# Patient Record
Sex: Female | Born: 1935 | Race: White | Hispanic: No | Smoking: Former smoker
Health system: Southern US, Community
[De-identification: ages and names within clinical notes are randomized; demographics above are authoritative.]

## PROBLEM LIST (undated history)

## (undated) DIAGNOSIS — E039 Hypothyroidism, unspecified: Secondary | ICD-10-CM

## (undated) DIAGNOSIS — B3731 Acute candidiasis of vulva and vagina: Secondary | ICD-10-CM

## (undated) DIAGNOSIS — I712 Thoracic aortic aneurysm, without rupture, unspecified: Secondary | ICD-10-CM

## (undated) DIAGNOSIS — I10 Essential (primary) hypertension: Secondary | ICD-10-CM

## (undated) DIAGNOSIS — K219 Gastro-esophageal reflux disease without esophagitis: Secondary | ICD-10-CM

## (undated) DIAGNOSIS — R0989 Other specified symptoms and signs involving the circulatory and respiratory systems: Secondary | ICD-10-CM

## (undated) DIAGNOSIS — M109 Gout, unspecified: Secondary | ICD-10-CM

## (undated) DIAGNOSIS — I739 Peripheral vascular disease, unspecified: Secondary | ICD-10-CM

## (undated) DIAGNOSIS — F419 Anxiety disorder, unspecified: Secondary | ICD-10-CM

## (undated) DIAGNOSIS — F0390 Unspecified dementia without behavioral disturbance: Secondary | ICD-10-CM

## (undated) DIAGNOSIS — B373 Candidiasis of vulva and vagina: Secondary | ICD-10-CM

## (undated) HISTORY — PX: ABDOMINAL HYSTERECTOMY: SHX81

## (undated) HISTORY — PX: CHOLECYSTECTOMY: SHX55

---

## 2019-05-11 ENCOUNTER — Emergency Department (HOSPITAL_BASED_OUTPATIENT_CLINIC_OR_DEPARTMENT_OTHER): Payer: Medicare Other

## 2019-05-11 ENCOUNTER — Encounter (HOSPITAL_BASED_OUTPATIENT_CLINIC_OR_DEPARTMENT_OTHER): Payer: Self-pay

## 2019-05-11 ENCOUNTER — Other Ambulatory Visit: Payer: Self-pay

## 2019-05-11 ENCOUNTER — Emergency Department (HOSPITAL_BASED_OUTPATIENT_CLINIC_OR_DEPARTMENT_OTHER)
Admission: EM | Admit: 2019-05-11 | Discharge: 2019-05-12 | Disposition: A | Discharge status: Home / Self Care | Payer: Medicare Other | Attending: Emergency Medicine | Admitting: Emergency Medicine

## 2019-05-11 DIAGNOSIS — E039 Hypothyroidism, unspecified: Secondary | ICD-10-CM | POA: Diagnosis not present

## 2019-05-11 DIAGNOSIS — K5792 Diverticulitis of intestine, part unspecified, without perforation or abscess without bleeding: Secondary | ICD-10-CM

## 2019-05-11 DIAGNOSIS — F0390 Unspecified dementia without behavioral disturbance: Secondary | ICD-10-CM | POA: Diagnosis not present

## 2019-05-11 DIAGNOSIS — I10 Essential (primary) hypertension: Secondary | ICD-10-CM | POA: Insufficient documentation

## 2019-05-11 DIAGNOSIS — K5732 Diverticulitis of large intestine without perforation or abscess without bleeding: Secondary | ICD-10-CM | POA: Diagnosis not present

## 2019-05-11 DIAGNOSIS — R1032 Left lower quadrant pain: Secondary | ICD-10-CM | POA: Diagnosis present

## 2019-05-11 DIAGNOSIS — Z87891 Personal history of nicotine dependence: Secondary | ICD-10-CM | POA: Insufficient documentation

## 2019-05-11 HISTORY — DX: Gout, unspecified: M10.9

## 2019-05-11 HISTORY — DX: Essential (primary) hypertension: I10

## 2019-05-11 HISTORY — DX: Candidiasis of vulva and vagina: B37.3

## 2019-05-11 HISTORY — DX: Anxiety disorder, unspecified: F41.9

## 2019-05-11 HISTORY — DX: Other specified symptoms and signs involving the circulatory and respiratory systems: R09.89

## 2019-05-11 HISTORY — DX: Unspecified dementia, unspecified severity, without behavioral disturbance, psychotic disturbance, mood disturbance, and anxiety: F03.90

## 2019-05-11 HISTORY — DX: Hypothyroidism, unspecified: E03.9

## 2019-05-11 HISTORY — DX: Gastro-esophageal reflux disease without esophagitis: K21.9

## 2019-05-11 HISTORY — DX: Thoracic aortic aneurysm, without rupture: I71.2

## 2019-05-11 HISTORY — DX: Peripheral vascular disease, unspecified: I73.9

## 2019-05-11 HISTORY — DX: Acute candidiasis of vulva and vagina: B37.31

## 2019-05-11 HISTORY — DX: Thoracic aortic aneurysm, without rupture, unspecified: I71.20

## 2019-05-11 LAB — CBC WITH DIFFERENTIAL/PLATELET
Abs Immature Granulocytes: 0.02 10*3/uL (ref 0.00–0.07)
Basophils Absolute: 0 10*3/uL (ref 0.0–0.1)
Basophils Relative: 0 %
Eosinophils Absolute: 0.2 10*3/uL (ref 0.0–0.5)
Eosinophils Relative: 2 %
HCT: 41.4 % (ref 36.0–46.0)
Hemoglobin: 13.9 g/dL (ref 12.0–15.0)
Immature Granulocytes: 0 %
Lymphocytes Relative: 15 %
Lymphs Abs: 1.3 10*3/uL (ref 0.7–4.0)
MCH: 30.6 pg (ref 26.0–34.0)
MCHC: 33.6 g/dL (ref 30.0–36.0)
MCV: 91.2 fL (ref 80.0–100.0)
Monocytes Absolute: 0.9 10*3/uL (ref 0.1–1.0)
Monocytes Relative: 11 %
Neutro Abs: 5.8 10*3/uL (ref 1.7–7.7)
Neutrophils Relative %: 72 %
Platelets: 140 10*3/uL — ABNORMAL LOW (ref 150–400)
RBC: 4.54 MIL/uL (ref 3.87–5.11)
RDW: 14.3 % (ref 11.5–15.5)
WBC: 8.2 10*3/uL (ref 4.0–10.5)
nRBC: 0 % (ref 0.0–0.2)

## 2019-05-11 LAB — COMPREHENSIVE METABOLIC PANEL
ALT: 13 U/L (ref 0–44)
AST: 25 U/L (ref 15–41)
Albumin: 3.5 g/dL (ref 3.5–5.0)
Alkaline Phosphatase: 56 U/L (ref 38–126)
Anion gap: 11 (ref 5–15)
BUN: 18 mg/dL (ref 8–23)
CO2: 23 mmol/L (ref 22–32)
Calcium: 8.4 mg/dL — ABNORMAL LOW (ref 8.9–10.3)
Chloride: 100 mmol/L (ref 98–111)
Creatinine, Ser: 0.93 mg/dL (ref 0.44–1.00)
GFR calc Af Amer: >60 mL/min (ref >60)
GFR calc non Af Amer: 56 mL/min — ABNORMAL LOW (ref >60)
Glucose, Bld: 205 mg/dL — ABNORMAL HIGH (ref 70–99)
Potassium: 3.8 mmol/L (ref 3.5–5.1)
Sodium: 134 mmol/L — ABNORMAL LOW (ref 135–145)
Total Bilirubin: 0.6 mg/dL (ref 0.3–1.2)
Total Protein: 7.4 g/dL (ref 6.5–8.1)

## 2019-05-11 LAB — URINALYSIS, ROUTINE W REFLEX MICROSCOPIC
Bilirubin Urine: NEGATIVE
Glucose, UA: NEGATIVE mg/dL
Ketones, ur: 15 mg/dL — AB
Nitrite: NEGATIVE
Protein, ur: NEGATIVE mg/dL
Specific Gravity, Urine: 1.02 (ref 1.005–1.030)
pH: 6.5 (ref 5.0–8.0)

## 2019-05-11 LAB — URINALYSIS, MICROSCOPIC (REFLEX)

## 2019-05-11 LAB — LIPASE, BLOOD: Lipase: 26 U/L (ref 11–51)

## 2019-05-11 MED ORDER — AMOXICILLIN-POT CLAVULANATE 875-125 MG PO TABS: 1.0000 | ORAL_TABLET | Freq: Two times a day (BID) | ORAL | 0 refills | Status: AC

## 2019-05-11 MED ORDER — AMOXICILLIN-POT CLAVULANATE 875-125 MG PO TABS
1.0000 | ORAL_TABLET | Freq: Once | ORAL | Status: AC
  Administered 2019-05-11: 1 via ORAL
  Filled 2019-05-11: qty 1

## 2019-05-11 MED ORDER — IOHEXOL 300 MG/ML  SOLN
100.0000 mL | Freq: Once | INTRAMUSCULAR | Status: AC | PRN
  Administered 2019-05-11: 100 mL via INTRAVENOUS

## 2019-05-11 NOTE — Discharge Instructions (Signed)
Your scan showed diverticulitis, please take Augmentin twice daily for the next week, make sure you take this with food on your stomach.  Continue taking laxatives to help make sure you are having bowel movements daily.  If you develop fevers, worsening pain, vomiting and cannot keep down antibiotics or any other new or concerning symptoms return to the ED otherwise follow closely with your PCP to ensure you are improving.

## 2019-05-11 NOTE — ED Triage Notes (Signed)
Per pt and daughter pt with lower abd x 3 days-denies n/v/d-denies urinary sx and bowel changes-NAD-to triage in w/c

## 2019-05-11 NOTE — ED Provider Notes (Signed)
[facility] EMERGENCY DEPARTMENT Provider Note   CSN: [id number] Arrival date & time: 05/11/19  1953     History Chief Complaint  Patient presents with  . Abdominal Pain    Valerie Little is a 86 y.o. female.  Valerie Little is a 86 y.o. female with a history of hypertension, GERD, hypothyroidism, PAD, thoracic aortic aneurysm, dementia, who presents to the ED for evaluation of 3 days of lower abdominal pain.  She is accompanied by her daughter who provides the majority of the history given patient's dementia.  She states that for the past 3 days her mom has been complaining intermittently of left lower quadrant abdominal pain.  She reports this is very atypical for her and she does not usually complain of belly pain.  She was previously seen and was told she was constipated so she has been taking MiraLAX every other day and has been having regular bowel movements.  She had some chills and a low-grade fever today.  Has not had any episodes of vomiting.  No blood noted in her stools.  She denies dysuria or urinary frequency.  Previous hysterectomy and cholecystectomy, no previous history of diverticulitis.        Past Medical History:  Diagnosis Date  . Anxiety   . Bruit   . Dementia (HCC)   . GERD (gastroesophageal reflux disease)   . Gout   . Hypertension   . Hypothyroid   . PAD (peripheral artery disease) (HCC)   . Thoracic aortic aneurysm (TAA) (HCC)   . Yeast vaginitis     There are no problems to display for this patient.   Past Surgical History:  Procedure Laterality Date  . ABDOMINAL HYSTERECTOMY    . CHOLECYSTECTOMY       OB History   No obstetric history on file.     No family history on file.  Social History   Tobacco Use  . Smoking status: Former Games developer  . Smokeless tobacco: Never Used  Substance Use Topics  . Alcohol use: Never  . Drug use: Never    Home Medications Prior to Admission medications   Not on File    Allergies     Allopurinol, Colchicine, Donepezil, Indomethacin, Memantine, Naproxen, and Rivastigmine  Review of Systems   Review of Systems  Constitutional: Positive for chills. Negative for fever.  HENT: Negative.   Respiratory: Negative for cough and shortness of breath.   Cardiovascular: Negative for chest pain.  Gastrointestinal: Positive for abdominal pain and diarrhea. Negative for blood in stool, constipation, nausea and vomiting.  Genitourinary: Negative for dysuria, flank pain and frequency.  Musculoskeletal: Negative for arthralgias, back pain and myalgias.  Skin: Negative for color change and rash.  Neurological: Negative for dizziness, syncope and light-headedness.    Physical Exam Updated Vital Signs BP (!) 168/87 (BP Location: Right Arm)   Pulse 89   Temp 99.2 F (37.3 C) (Oral)   Resp 14   SpO2 94%   Physical Exam Vitals and nursing note reviewed.  Constitutional:      General: She is not in acute distress.    Appearance: She is well-developed. She is not ill-appearing or diaphoretic.     Comments: Pleasantly demented, well-appearing  HENT:     Head: Normocephalic and atraumatic.  Eyes:     General:        Right eye: No discharge.        Left eye: No discharge.     Pupils: Pupils are  equal, round, and reactive to light.  Cardiovascular:     Rate and Rhythm: Normal rate and regular rhythm.     Heart sounds: Normal heart sounds. No murmur. No friction rub. No gallop.   Pulmonary:     Effort: Pulmonary effort is normal. No respiratory distress.     Breath sounds: Normal breath sounds. No wheezing or rales.     Comments: Respirations equal and unlabored, patient able to speak in full sentences, lungs clear to auscultation bilaterally Abdominal:     General: Bowel sounds are normal. There is no distension.     Palpations: Abdomen is soft. There is no mass.     Tenderness: There is abdominal tenderness in the left lower quadrant. There is no guarding.     Comments:  Abdomen is soft, nondistended, bowel sounds present throughout, there is focal tenderness in the left lower quadrant without guarding, all other quadrants nontender, no peritoneal signs, no CVA tenderness.  Musculoskeletal:        General: No deformity.     Cervical back: Neck supple.  Skin:    General: Skin is warm and dry.     Capillary Refill: Capillary refill takes less than 2 seconds.  Neurological:     Mental Status: She is alert.     Coordination: Coordination normal.     Comments: Speech is clear, able to follow commands Moves extremities without ataxia, coordination intact  Psychiatric:        Mood and Affect: Mood normal.        Behavior: Behavior normal.     ED Results / Procedures / Treatments   Labs (all labs ordered are listed, but only abnormal results are displayed) Labs Reviewed  CBC WITH DIFFERENTIAL/PLATELET - Abnormal; Notable for the following components:      Result Value   Platelets 140 (*)    All other components within normal limits  URINALYSIS, ROUTINE W REFLEX MICROSCOPIC - Abnormal; Notable for the following components:   APPearance CLOUDY (*)    Hgb urine dipstick TRACE (*)    Ketones, ur 15 (*)    Leukocytes,Ua TRACE (*)    All other components within normal limits  URINALYSIS, MICROSCOPIC (REFLEX) - Abnormal; Notable for the following components:   Bacteria, UA FEW (*)    All other components within normal limits  COMPREHENSIVE METABOLIC PANEL  LIPASE, BLOOD    EKG None  Radiology CT ABDOMEN PELVIS W CONTRAST  Result Date: 05/11/2019 CLINICAL DATA:  86 year old female with left lower quadrant abdominal pain. EXAM: CT ABDOMEN AND PELVIS WITH CONTRAST TECHNIQUE: Multidetector CT imaging of the abdomen and pelvis was performed using the standard protocol following bolus administration of intravenous contrast. CONTRAST:  OMNIPAQUE IOHEXOL 300 MG/ML  SOLN COMPARISON:  None. FINDINGS: Lower chest: There is emphysematous changes of the  visualized lung bases. The lung bases are otherwise clear. Coronary vascular calcifications noted. No intra-abdominal free air or free fluid. Hepatobiliary: The liver is unremarkable. There is mild intrahepatic biliary ductal dilatation as well as mild dilatation of the common bile duct, likely post cholecystectomy. No calcified stone noted in the central CBD. Pancreas: Unremarkable. No pancreatic ductal dilatation or surrounding inflammatory changes. Spleen: Normal in size without focal abnormality. Adrenals/Urinary Tract: The adrenal glands are unremarkable. There is no hydronephrosis on either side. There is symmetric enhancement and excretion contrast by both kidneys. The visualized ureters appear unremarkable. The urinary bladder is partially distended. Mild thickened appearance of the bladder wall, likely reactive to inflammatory  changes of the sigmoid colon. Stomach/Bowel: There is extensive sigmoid diverticulosis. There is inflammatory changes of a segment of the sigmoid colon consistent with acute diverticulitis. No diverticular abscess or perforation. There is no bowel obstruction. The appendix is not visualized with certainty. No inflammatory changes identified in the right lower quadrant. Vascular/Lymphatic: Advanced aortoiliac atherosclerotic disease. The IVC is unremarkable. No portal venous gas. There is no adenopathy. Reproductive: Hysterectomy. Other: None Musculoskeletal: Osteopenia with degenerative changes of the spine. No acute osseous pathology. IMPRESSION: Sigmoid diverticulitis. No diverticular abscess or perforation. Electronically Signed   By: Anner Crete M.D.   On: 05/11/2019 22:26    Procedures Procedures (including critical care time)  Medications Ordered in ED Medications - No data to display  ED Course  I have reviewed the triage vital signs and the nursing notes.  Pertinent labs & imaging results that were available during my care of the patient were reviewed by me  and considered in my medical decision making (see chart for details).    MDM Rules/Calculators/A&P                     86 year old female with history of dementia, presenting with 3 days of left lower quadrant abdominal pain, low-grade fever noted but all other vitals normal and patient is well-appearing.  She is focally tender in the left lower quadrant, but without peritoneal signs.  Will evaluate with abdominal labs and CT.  Concern for potential diverticulitis, colitis, UTI, kidney stone, pyelonephritis, appendicitis.  I have independently ordered, reviewed and interpreted all labs and imaging: CBC: No leukocytosis, normal hemoglobin CMP: Mild hyperglycemia, 205, no other significant electrolyte derangements, normal renal and liver function. Lipase: WNL Urinalysis: Trace leukocytes, few bacteria with 6-10 squamous cells, suspect contamination  CT abdomen pelvis consistent with uncomplicated sigmoid diverticulitis, no evidence of abscess or perforation, I have reviewed images independently and agree with radiologist finding.  Discussed findings with patient and daughter, patient is very well-appearing and tolerating p.o.  Will treat with Augmentin, first dose given here in the emergency department.  Encourage patient to continue bowel regimen, Tylenol as needed for pain.  Discharged home in good condition, strict return precautions discussed and PCP follow-up encouraged.  Patient discussed with Dr. Maryan Rued, who saw patient as well and agrees with plan.   Final Clinical Impression(s) / ED Diagnoses Final diagnoses:  Diverticulitis    Rx / DC Orders ED Discharge Orders         Ordered    amoxicillin-clavulanate (AUGMENTIN) 875-125 MG tablet  2 times daily     05/11/19 2307           Jacqlyn Larsen, PA-C 05/11/19 2341    Blanchie Dessert, MD 05/14/19 1547

## 2019-05-12 ENCOUNTER — Telehealth (HOSPITAL_BASED_OUTPATIENT_CLINIC_OR_DEPARTMENT_OTHER): Payer: Self-pay | Admitting: Emergency Medicine

## 2019-11-19 DEATH — deceased

## 2021-05-06 IMAGING — CT CT ABD-PELV W/ CM
2 of 5 series · 16 of 46 positions shown, 18 images · IV contrast (Omnipaque)
Comparison: None.

CLINICAL DATA: 86-year-old female with left lower quadrant
abdominal pain.

EXAM:
CT ABDOMEN AND PELVIS WITH CONTRAST
TECHNIQUE: Multidetector CT imaging of the abdomen and pelvis was performed
using the standard protocol following bolus administration of
intravenous contrast.
CONTRAST:  100mL OMNIPAQUE IOHEXOL 300 MG/ML  SOLN

[Series 2: axial st · axial · 0.66mm/px · z∈[-384,-74]mm · 13 of 70 slices shown, 15 images]
[im 4/70  soft-tissue]
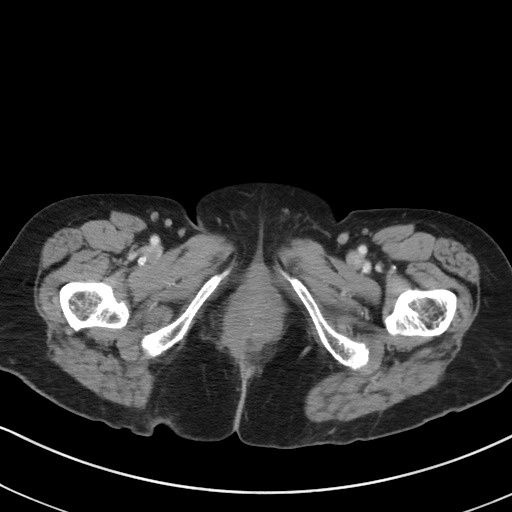
[im 4/70  bone]
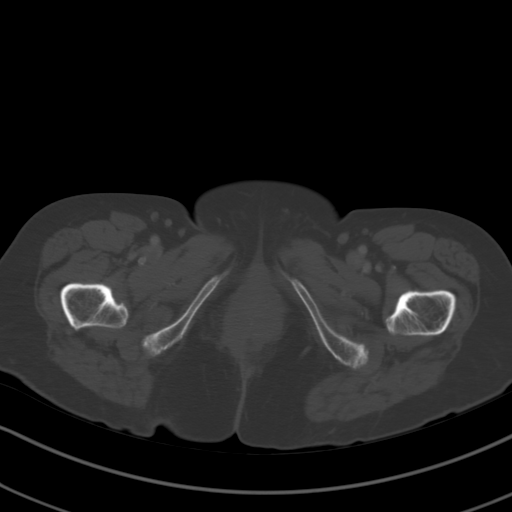
[im 11/70  soft-tissue]
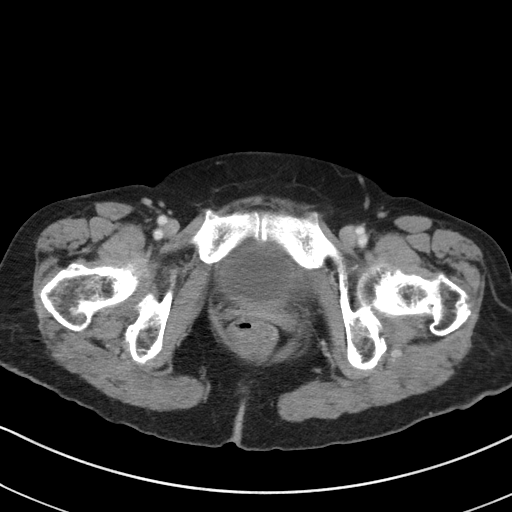
[im 15/70  soft-tissue]
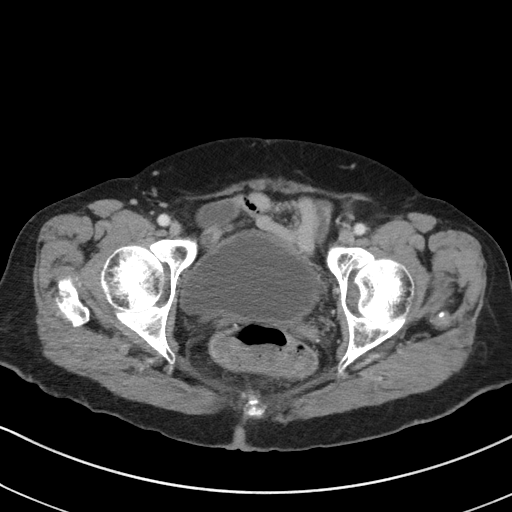
[im 19/70  soft-tissue]
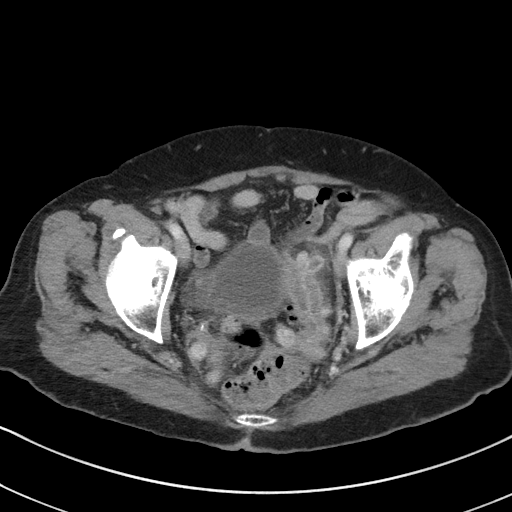
[im 26/70  soft-tissue]
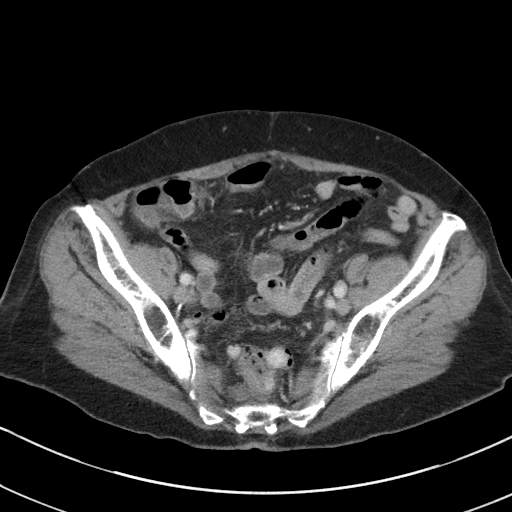
[im 30/70  soft-tissue]
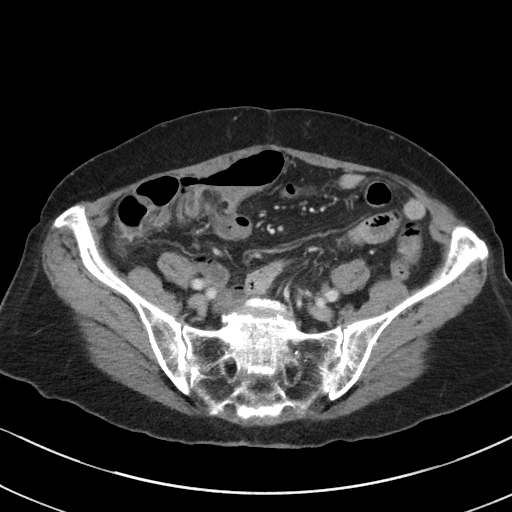
[im 37/70  soft-tissue]
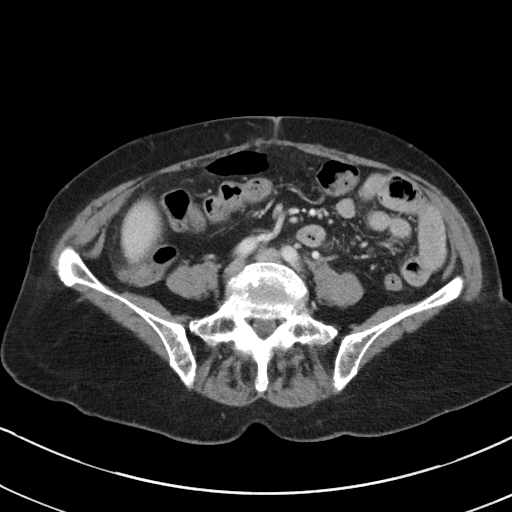
[im 40/70  soft-tissue]
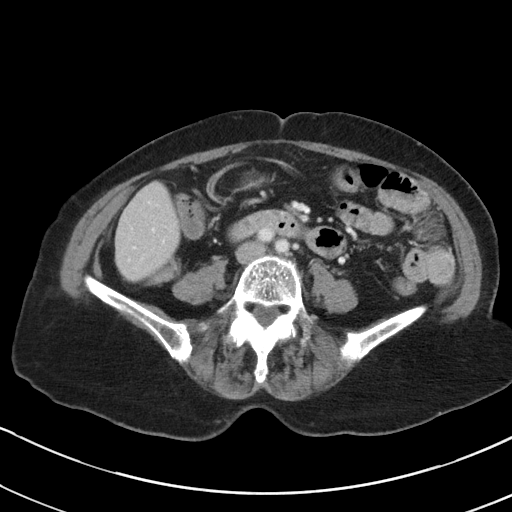
[im 44/70  soft-tissue]
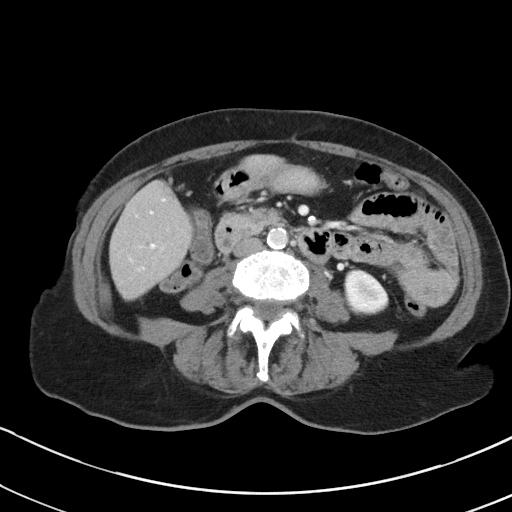
[im 44/70  bone]
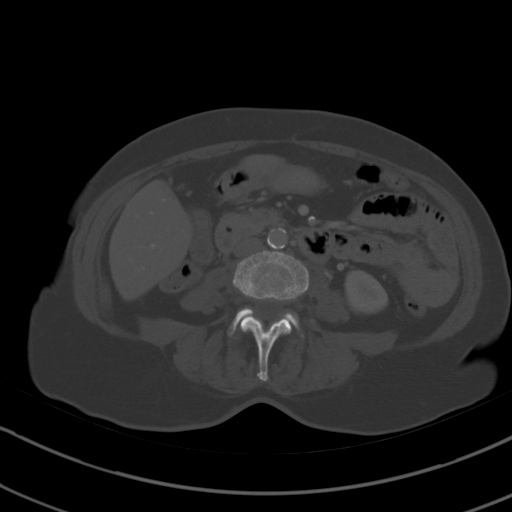
[im 51/70  soft-tissue]
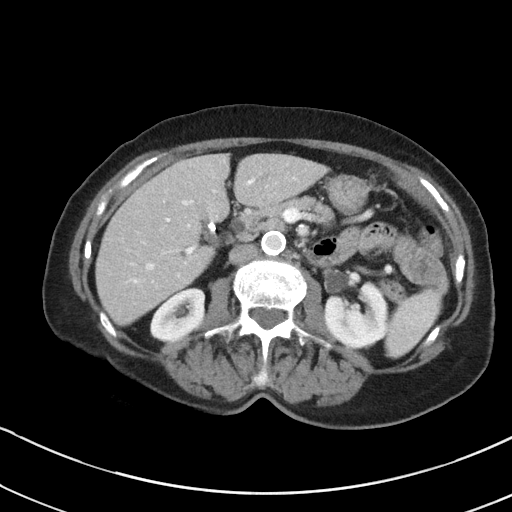
[im 55/70  soft-tissue]
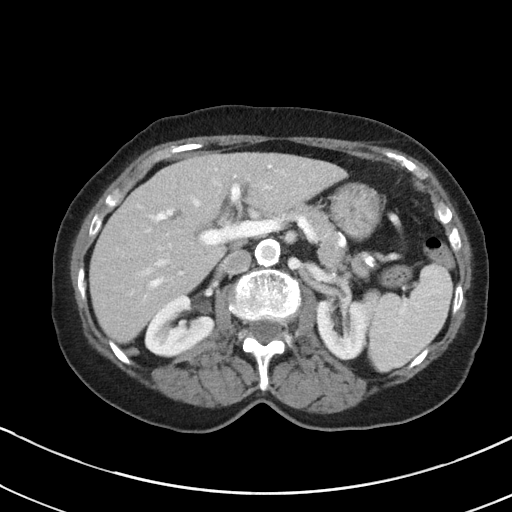
[im 59/70  soft-tissue]
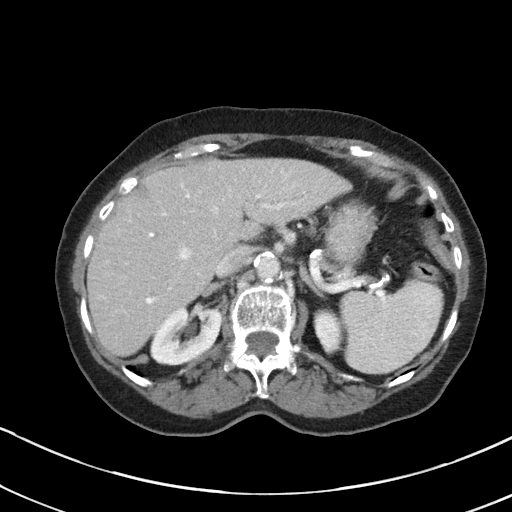
[im 66/70  soft-tissue]
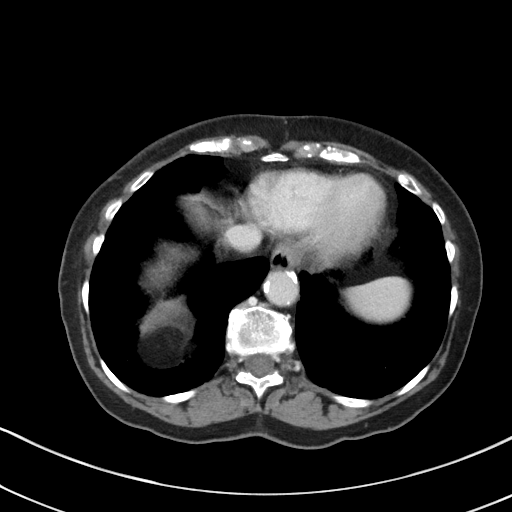

[Series 5: coronal st · coronal · 0.64mm/px · 3 of 74 slices shown]
[im 25/74  soft-tissue]
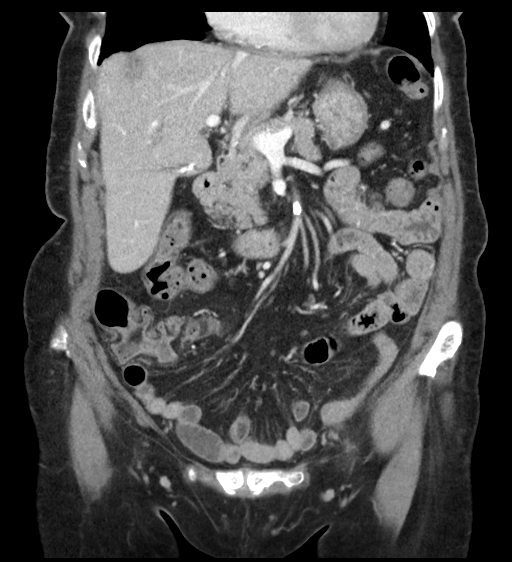
[im 33/74  soft-tissue]
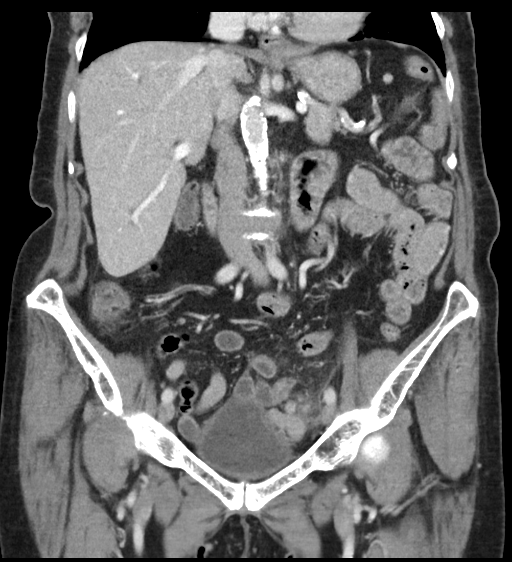
[im 41/74  soft-tissue]
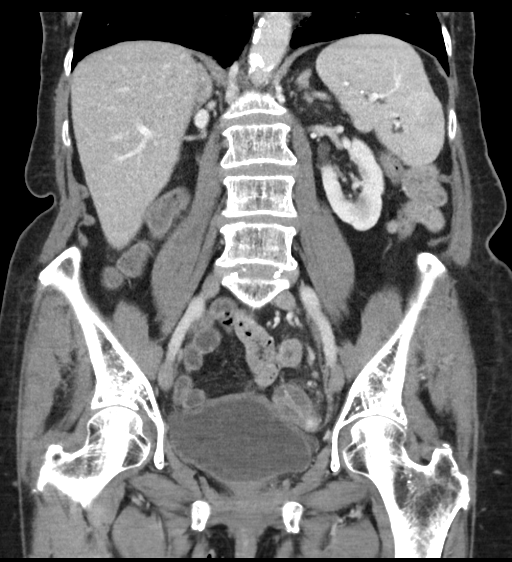

[16 of 46 positions shown; findings below may reference images not displayed]

FINDINGS: Lower chest: There is emphysematous changes of the visualized lung
bases. The lung bases are otherwise clear. Coronary vascular
calcifications noted.

No intra-abdominal free air or free fluid.

Hepatobiliary: The liver is unremarkable. There is mild intrahepatic
biliary ductal dilatation as well as mild dilatation of the common
bile duct, likely post cholecystectomy. No calcified stone noted in
the central CBD.

Pancreas: Unremarkable. No pancreatic ductal dilatation or
surrounding inflammatory changes.

Spleen: Normal in size without focal abnormality.

Adrenals/Urinary Tract: The adrenal glands are unremarkable. There
is no hydronephrosis on either side. There is symmetric enhancement
and excretion contrast by both kidneys. The visualized ureters
appear unremarkable. The urinary bladder is partially distended.
Mild thickened appearance of the bladder wall, likely reactive to
inflammatory changes of the sigmoid colon.

Stomach/Bowel: There is extensive sigmoid diverticulosis. There is
inflammatory changes of a segment of the sigmoid colon consistent
with acute diverticulitis. No diverticular abscess or perforation.
There is no bowel obstruction. The appendix is not visualized with
certainty. No inflammatory changes identified in the right lower
quadrant.

Vascular/Lymphatic: Advanced aortoiliac atherosclerotic disease. The
IVC is unremarkable. No portal venous gas. There is no adenopathy.

Reproductive: Hysterectomy.

Other: None

Musculoskeletal: Osteopenia with degenerative changes of the spine.
No acute osseous pathology.
IMPRESSION: Sigmoid diverticulitis. No diverticular abscess or perforation.
